# Patient Record
Sex: Female | Born: 1975 | Marital: Single | Smoking: Never smoker
Health system: Southern US, Community
[De-identification: ages and names within clinical notes are randomized; demographics above are authoritative.]

## PROBLEM LIST (undated history)

## (undated) DIAGNOSIS — N871 Moderate cervical dysplasia: Secondary | ICD-10-CM

## (undated) DIAGNOSIS — B977 Papillomavirus as the cause of diseases classified elsewhere: Secondary | ICD-10-CM

## (undated) DIAGNOSIS — R87629 Unspecified abnormal cytological findings in specimens from vagina: Secondary | ICD-10-CM

## (undated) HISTORY — PX: HX LEEP PROCEDURE: SHX91

## (undated) HISTORY — PX: LAPAROSCOPIC TUBAL LIGATION: SUR803

## (undated) HISTORY — DX: Moderate cervical dysplasia: N87.1

## (undated) HISTORY — DX: Papillomavirus as the cause of diseases classified elsewhere: B97.7

## (undated) HISTORY — PX: HX WISDOM TEETH EXTRACTION: SHX21

## (undated) HISTORY — PX: HX TONSILLECTOMY: SHX27

## (undated) HISTORY — PX: LEEP: SHX91

## (undated) HISTORY — PX: COLPOSCOPY: SHX161

## (undated) HISTORY — DX: Unspecified abnormal cytological findings in specimens from vagina: R87.629

## (undated) HISTORY — PX: CERVICAL CONE BIOPSY: SUR198

## (undated) HISTORY — PX: HYSTEROSCOPY: SHX211

---

## 2010-07-01 HISTORY — PX: HX LEEP PROCEDURE: SHX91

## 2011-07-02 HISTORY — PX: HX HYSTEROSCOPY: 2100001316

## 2014-05-04 ENCOUNTER — Encounter: Payer: Self-pay | Admitting: *Deleted

## 2014-05-04 DIAGNOSIS — R87619 Unspecified abnormal cytological findings in specimens from cervix uteri: Secondary | ICD-10-CM | POA: Insufficient documentation

## 2014-05-04 DIAGNOSIS — Z9889 Other specified postprocedural states: Secondary | ICD-10-CM

## 2014-05-04 DIAGNOSIS — R87618 Other abnormal cytological findings on specimens from cervix uteri: Secondary | ICD-10-CM

## 2014-05-10 ENCOUNTER — Encounter: Payer: Self-pay | Admitting: Obstetrics and Gynecology

## 2014-05-10 ENCOUNTER — Ambulatory Visit (INDEPENDENT_AMBULATORY_CARE_PROVIDER_SITE_OTHER): Payer: Self-pay | Admitting: Obstetrics and Gynecology

## 2014-05-10 VITALS — BP 110/70 | Ht 61.0 in | Wt 122.0 lb

## 2014-05-10 DIAGNOSIS — Z3202 Encounter for pregnancy test, result negative: Secondary | ICD-10-CM

## 2014-05-10 DIAGNOSIS — B977 Papillomavirus as the cause of diseases classified elsewhere: Secondary | ICD-10-CM | POA: Insufficient documentation

## 2014-05-10 DIAGNOSIS — Z3200 Encounter for pregnancy test, result unknown: Secondary | ICD-10-CM

## 2014-05-10 DIAGNOSIS — R87610 Atypical squamous cells of undetermined significance on cytologic smear of cervix (ASC-US): Secondary | ICD-10-CM

## 2014-05-10 NOTE — Progress Notes (Signed)
Patient ID: Rocky Craftsmanda Dimiceli, female   DOB: 1976-01-19, 38 y.o.   MRN: [id number] Pt here today for colposcopy. Pt has had a colposcopy, LEEP ,and cone biopsy for multiple abnormal paps. UPT is negative today, pt is currently on BCP. Consent signed and procedure explained.   Rocky Craftsmanda Kynard 38 y.o. G1P1001 here for colposcopy for abnormal pap at health dept. results not identified in chart pap smear on ASCUS + HPV.  Discussed role for HPV in cervical dysplasia, need for surveillance.  Patient given informed consent, signed copy in the chart, time out was performed.  Placed in lithotomy position. Cervix viewed with speculum and colposcope after application of acetic acid.   Colposcopy adequate? Yes  HPV changes noted at 6 o'clock; only a 1-2 mm area of condylomatous changes biopsies obtained at unable to biopsy due to fibrosis from prior CKC, Results reviewed , and any further biopsy considered excessive.   ECC specimen obtained.stenotic, cannot allow ecc   Colposcopy IMPRESSION:  Patient was given post procedure instructions. Will follow up pathology and manage accordingly.  Routine preventative health maintenance measures emphasized.

## 2014-05-30 LAB — POCT URINE PREGNANCY: Preg Test, Ur: NEGATIVE

## 2015-03-08 LAB — HM PAP SMEAR: HM Pap smear: NEGATIVE

## 2015-04-07 ENCOUNTER — Encounter: Payer: Self-pay | Admitting: Obstetrics and Gynecology

## 2015-04-21 ENCOUNTER — Encounter: Payer: Self-pay | Admitting: *Deleted

## 2015-04-21 DIAGNOSIS — A630 Anogenital (venereal) warts: Secondary | ICD-10-CM

## 2015-04-25 ENCOUNTER — Encounter: Payer: Self-pay | Admitting: Obstetrics & Gynecology

## 2015-04-28 ENCOUNTER — Encounter: Payer: Self-pay | Admitting: Obstetrics & Gynecology

## 2015-05-02 ENCOUNTER — Ambulatory Visit (INDEPENDENT_AMBULATORY_CARE_PROVIDER_SITE_OTHER): Payer: No Typology Code available for payment source | Admitting: Obstetrics & Gynecology

## 2015-05-02 ENCOUNTER — Encounter: Payer: Self-pay | Admitting: Obstetrics & Gynecology

## 2015-05-02 VITALS — BP 100/60 | HR 68 | Ht 61.0 in | Wt 118.0 lb

## 2015-05-02 DIAGNOSIS — R896 Abnormal cytological findings in specimens from other organs, systems and tissues: Secondary | ICD-10-CM | POA: Diagnosis not present

## 2015-05-02 DIAGNOSIS — Z3202 Encounter for pregnancy test, result negative: Secondary | ICD-10-CM | POA: Diagnosis not present

## 2015-05-02 DIAGNOSIS — IMO0002 Reserved for concepts with insufficient information to code with codable children: Secondary | ICD-10-CM

## 2015-05-02 LAB — POCT URINE PREGNANCY: Preg Test, Ur: NEGATIVE

## 2015-05-02 NOTE — Progress Notes (Signed)
Patient ID: Alexis Hampton, Alexis Hampton   DOB: 1975-07-23, 39 y.o.   MRN: [id number] Colposcopy Procedure Note:  Colposcopy Procedure Note  Indications: Pap smear 2 months ago showed: ASCUS with POSITIVE high risk HPV. The prior pap showed ASCUS with POSITIVE high risk HPV.  Prior cervical/vaginal disease: normal exam without visible pathology. Prior cervical treatment: cone and LEEP in past.  Her last cervix procedure 4 years ago  Smoker:  No. New sexual partner:  Yes.    : time frame:  4 years  History of abnormal Pap: yes  Procedure Details  The risks and benefits of the procedure and Verbal written informed consent obtained.  Speculum placed in vagina and excellent visualization of cervix achieved, cervix swabbed x 3 with acetic acid solution.  Findings: Cervix: no visible lesions, no mosaicism, no punctation and no abnormal vasculature; no biopsies taken. Vaginal inspection: vaginal colposcopy performed and was noral Vulvar colposcopy: vulvar colposcopy not performed.  Specimens: none  Complications: none.  Plan: Follow up Pap here in 1 year

## 2016-05-02 ENCOUNTER — Ambulatory Visit (INDEPENDENT_AMBULATORY_CARE_PROVIDER_SITE_OTHER): Payer: BLUE CROSS/BLUE SHIELD | Admitting: Women's Health

## 2016-05-02 ENCOUNTER — Other Ambulatory Visit (HOSPITAL_COMMUNITY)
Admission: RE | Admit: 2016-05-02 | Discharge: 2016-05-02 | Disposition: A | Discharge status: Home / Self Care | Payer: BLUE CROSS/BLUE SHIELD | Source: Ambulatory Visit | Attending: Obstetrics & Gynecology | Admitting: Obstetrics & Gynecology

## 2016-05-02 ENCOUNTER — Encounter: Payer: Self-pay | Admitting: Women's Health

## 2016-05-02 VITALS — BP 112/74 | HR 68 | Wt 117.5 lb

## 2016-05-02 DIAGNOSIS — Z01411 Encounter for gynecological examination (general) (routine) with abnormal findings: Secondary | ICD-10-CM

## 2016-05-02 DIAGNOSIS — Z01419 Encounter for gynecological examination (general) (routine) without abnormal findings: Secondary | ICD-10-CM | POA: Insufficient documentation

## 2016-05-02 DIAGNOSIS — Z1151 Encounter for screening for human papillomavirus (HPV): Secondary | ICD-10-CM | POA: Insufficient documentation

## 2016-05-02 DIAGNOSIS — N6320 Unspecified lump in the left breast, unspecified quadrant: Secondary | ICD-10-CM | POA: Diagnosis not present

## 2016-05-02 MED ORDER — LEVONORGEST-ETH ESTRAD 91-DAY 0.15-0.03 &0.01 MG PO TABS: 1.0000 | ORAL_TABLET | Freq: Every day | ORAL | 11 refills | Status: AC

## 2016-05-02 NOTE — Patient Instructions (Addendum)
[facility] Tues 11/7 @ 11:20pm, be there 11:10pm. No lotion/deoderant/powder/perfume that day  [facility] [place] [address] [place] [place] [place] 11/11  Ethinyl Estradiol; Levonorgestrel tablets What is this medicine? ETHINYL ESTRADIOL; LEVONORGESTREL (ETH in il es tra DYE ole; LEE voh nor jes trel) is an oral contraceptive. It combines two types of female hormones, an estrogen and a progestin. They are used to prevent ovulation and pregnancy. This medicine may be used for other purposes; ask your health care provider or pharmacist if you have questions. What should I tell my health care provider before I take this medicine? They need to know if you have or ever had any of these conditions: -abnormal vaginal bleeding -blood vessel disease or blood clots -breast, cervical, endometrial, ovarian, liver, or uterine cancer -diabetes -gallbladder disease -heart disease or recent heart attack -high blood pressure -high cholesterol -kidney disease -liver disease -migraine headaches -stroke -systemic lupus erythematosus (SLE) -tobacco smoker -an unusual or allergic reaction to estrogens, progestins, other medicines, foods, dyes, or preservatives -pregnant or trying to get pregnant -breast-feeding How should I use this medicine? Take this medicine by mouth. To reduce nausea, this medicine may be taken with food. Follow the directions on the prescription label. Take this medicine at the same time each day and in the order directed on the package. Do not take your medicine more often than directed. Contact your pediatrician regarding the use of this medicine in children. Special care may be needed. This medicine has been used in female children who have started having menstrual periods. A patient package insert for the product will be given with each prescription and refill. Read this sheet carefully each time. The sheet may change frequently. Overdosage:  If you think you have taken too much of this medicine contact a poison control center or emergency room at once. NOTE: This medicine is only for you. Do not share this medicine with others. What if I miss a dose? If you miss a dose, refer to the patient information sheet you received with your medicine for direction. If you miss more than one pill, this medicine may not be as effective and you may need to use another form of birth control. What may interact with this medicine? -acetaminophen -antibiotics or medicines for infections, especially rifampin, rifabutin, rifapentine, and griseofulvin, and possibly penicillins or tetracyclines -aprepitant -ascorbic acid (vitamin C) -atorvastatin -barbiturate medicines, such as phenobarbital -bosentan -carbamazepine -caffeine -clofibrate -cyclosporine -dantrolene -doxercalciferol -felbamate -grapefruit juice -hydrocortisone -medicines for anxiety or sleeping problems, such as diazepam or temazepam -medicines for diabetes, including pioglitazone -mineral oil -modafinil -mycophenolate -nefazodone -oxcarbazepine -phenytoin -prednisolone -ritonavir or other medicines for HIV infection or AIDS -rosuvastatin -selegiline -soy isoflavones supplements -St. John's wort -tamoxifen or raloxifene -theophylline -thyroid hormones -topiramate -warfarin This list may not describe all possible interactions. Give your health care provider a list of all the medicines, herbs, non-prescription drugs, or dietary supplements you use. Also tell them if you smoke, drink alcohol, or use illegal drugs. Some items may interact with your medicine. What should I watch for while using this medicine? Visit your doctor or health care professional for regular checks on your progress. You will need a regular breast and pelvic exam and Pap smear while on this medicine. Use an additional method of contraception during the first cycle that you take these tablets. If you  have any reason to think you are pregnant, stop taking this medicine right away and contact your doctor or health care  professional. If you are taking this medicine for hormone related problems, it may take several cycles of use to see improvement in your condition. Smoking increases the risk of getting a blood clot or having a stroke while you are taking birth control pills, especially if you are more than 40 years old. You are strongly advised not to smoke. This medicine can make your body retain fluid, making your fingers, hands, or ankles swell. Your blood pressure can go up. Contact your doctor or health care professional if you feel you are retaining fluid. This medicine can make you more sensitive to the sun. Keep out of the sun. If you cannot avoid being in the sun, wear protective clothing and use sunscreen. Do not use sun lamps or tanning beds/booths. If you wear contact lenses and notice visual changes, or if the lenses begin to feel uncomfortable, consult your eye care specialist. In some women, tenderness, swelling, or minor bleeding of the gums may occur. Notify your dentist if this happens. Brushing and flossing your teeth regularly may help limit this. See your dentist regularly and inform your dentist of the medicines you are taking. If you are going to have elective surgery, you may need to stop taking this medicine before the surgery. Consult your health care professional for advice. This medicine does not protect you against HIV infection (AIDS) or any other sexually transmitted diseases. What side effects may I notice from receiving this medicine? Side effects that you should report to your doctor or health care professional as soon as possible: -breast tissue changes or discharge -changes in vaginal bleeding during your period or between your periods -chest pain -coughing up blood -dizziness or fainting spells -headaches or migraines -leg, arm or groin pain -severe or sudden  headaches -stomach pain (severe) -sudden shortness of breath -sudden loss of coordination, especially on one side of the body -speech problems -symptoms of vaginal infection like itching, irritation or unusual discharge -tenderness in the upper abdomen -vomiting -weakness or numbness in the arms or legs, especially on one side of the body -yellowing of the eyes or skin Side effects that usually do not require medical attention (report to your doctor or health care professional if they continue or are bothersome): -breakthrough bleeding and spotting that continues beyond the 3 initial cycles of pills -breast tenderness -mood changes, anxiety, depression, frustration, anger, or emotional outbursts -increased sensitivity to sun or ultraviolet light -nausea -skin rash, acne, or brown spots on the skin -weight gain (slight) This list may not describe all possible side effects. Call your doctor for medical advice about side effects. You may report side effects to FDA at 1-800-FDA-1088. Where should I keep my medicine? Keep out of the reach of children. Store at room temperature between 15 and 30 degrees C (59 and 86 degrees F). Throw away any unused medicine after the expiration date. NOTE: This sheet is a summary. It may not cover all possible information. If you have questions about this medicine, talk to your doctor, pharmacist, or health care provider.    2016, Elsevier/Gold Standard. (2013-09-27 10:20:56)

## 2016-05-02 NOTE — Progress Notes (Signed)
Subjective:   Rocky Craftsmanda Crookston is a 40 y.o. 561P1001 Caucasian female here for a routine well-woman exam.  No LMP recorded (lmp unknown). Patient is not currently having periods (Reason: Oral contraceptives).    Current complaints: none PCP: none       Does not desire labs H/O multiple abnormal paps, has had LEEP and Cone bx  Social History: Sexual: heterosexual Marital Status: dating Living situation: w/ boyfriend and son Occupation: nanny Tobacco/alcohol: no tobacco or etoh Illicit drugs: no history of illicit drug use  The following portions of the patient's history were reviewed and updated as appropriate: allergies, current medications, past family history, past medical history, past social history, past surgical history and problem list.  Past Medical History Past Medical History:  Diagnosis Date  . Vaginal Pap smear, abnormal    multiple     Past Surgical History Past Surgical History:  Procedure Laterality Date  . CERVICAL CONE BIOPSY    . COLPOSCOPY    . HYSTEROSCOPY    . LEEP      Gynecologic History G1P1001  No LMP recorded (lmp unknown). Patient is not currently having periods (Reason: Oral contraceptives). Contraception: OCP (estrogen/progesterone) [facility] Results were: normal Last mammogram: never. Results were: n/a Last TCS: never  Obstetric History OB History  Gravida Para Term Preterm AB Living  1 1 1     1   SAB TAB Ectopic Multiple Live Births          1    # Outcome Date GA Lbr Len/2nd Weight Sex Delivery Anes PTL Lv  1 Term 08/24/02 [id number]  9 lb 1 oz (4.111 kg) M CS-Unspec  N LIV      Current Medications Current Outpatient Prescriptions on File Prior to Visit  Medication Sig Dispense Refill  . Multiple Vitamin (MULTIVITAMIN) tablet Take 1 tablet by mouth daily.    . Levonorgestrel-Ethinyl Estradiol (SEASONIQUE) 0.15-0.03 &0.01 MG tablet Take 1 tablet by mouth daily.     No current facility-administered medications on file prior  to visit.     Review of Systems Patient denies any headaches, blurred vision, shortness of breath, chest pain, abdominal pain, problems with bowel movements, urination, or intercourse.  Objective:  BP 112/74 (BP Location: Right Arm, Patient Position: Sitting, Cuff Size: Normal)   Pulse 68   Wt 117 lb 8 oz (53.3 kg)   LMP  (LMP Unknown)   BMI 22.20 kg/m  Physical Exam  General:  Well developed, well nourished, no acute distress. She is alert and oriented x3. Skin:  Warm and dry Neck:  Midline trachea, no thyromegaly or nodules Cardiovascular: Regular rate and rhythm, no murmur heard Lungs:  Effort normal, all lung fields clear to auscultation bilaterally Breasts:  Rt: No dominant palpable mass, retraction, or nipple discharge, Lt: large palpable ridge/mass- feels like fatty tissue Abdomen:  Soft, non tender, no hepatosplenomegaly or masses Pelvic:  External genitalia is normal in appearance.  The vagina is normal in appearance. The cervix is stenotic and flat-has had multiple cervical procedures in past, no CMT.  Thin prep pap is done w/ HR HPV cotesting. Uterus is felt to be normal size, shape, and contour.  No adnexal masses or tenderness noted. Extremities:  No swelling or varicosities noted Psych:  She has a normal mood and affect  Assessment:   Healthy well-woman exam H/o multiple abnormal paps w/ cervical procedures Lt breast mass  Plan:  Breast u/s and mammo 11/7 @ 11:20, be there at 11:10  at [facility], no lotion/deoderant/powder/perfume that day F/U 2849yr for physical, or sooner if needed Mammogram as above  Colonoscopy @40yo , or sooner if problems  Marge DuncansBooker, Erubiel Manasco Randall CNM, [facility]NP[facility]BC 05/02/2016 2:07 PM

## 2016-05-06 ENCOUNTER — Other Ambulatory Visit: Payer: No Typology Code available for payment source | Admitting: Obstetrics & Gynecology

## 2016-05-06 LAB — CYTOLOGY - PAP
ADEQUACY: ABSENT
Diagnosis: NEGATIVE
HPV (WINDOPATH): NOT DETECTED

## 2016-05-07 ENCOUNTER — Ambulatory Visit (HOSPITAL_COMMUNITY)
Admission: RE | Admit: 2016-05-07 | Discharge: 2016-05-07 | Disposition: A | Discharge status: Home / Self Care | Payer: BLUE CROSS/BLUE SHIELD | Source: Ambulatory Visit | Attending: Women's Health | Admitting: Women's Health

## 2016-05-07 DIAGNOSIS — N6002 Solitary cyst of left breast: Secondary | ICD-10-CM | POA: Diagnosis not present

## 2016-05-07 DIAGNOSIS — N6320 Unspecified lump in the left breast, unspecified quadrant: Secondary | ICD-10-CM

## 2016-07-12 ENCOUNTER — Telehealth: Payer: Self-pay | Admitting: *Deleted

## 2016-07-12 NOTE — Telephone Encounter (Signed)
Spoke with pt letting her know her pap was normal. Pt voiced understanding. JSY 

## 2017-01-09 IMAGING — US ULTRASOUND LEFT BREAST LIMITED
1 series · 8 of 8 positions shown · non-contrast
Comparison: None.

CLINICAL DATA: 39-year-old female presenting for baseline mammogram
and evaluation of a palpable area of concern in the left breast at 3
o'clock identified by her physician.

EXAM:
2D DIGITAL DIAGNOSTIC BILATERAL MAMMOGRAM WITH CAD AND ADJUNCT TOMO
LEFT BREAST ULTRASOUND

[Series 1: ultrasound left breast limited · 0.07mm/px · 8 of 8 slices shown]
[im 1/8]
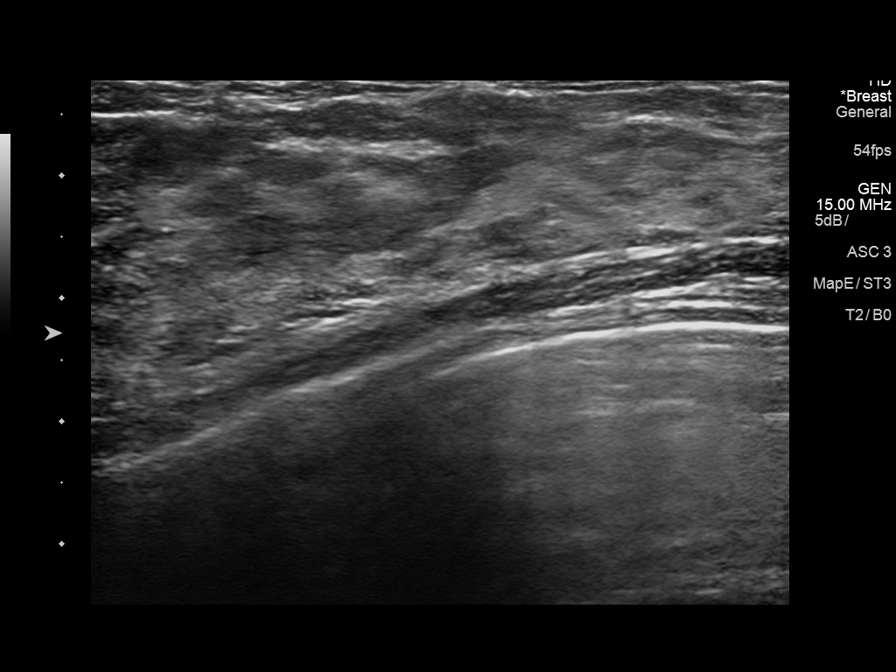
[im 2/8]
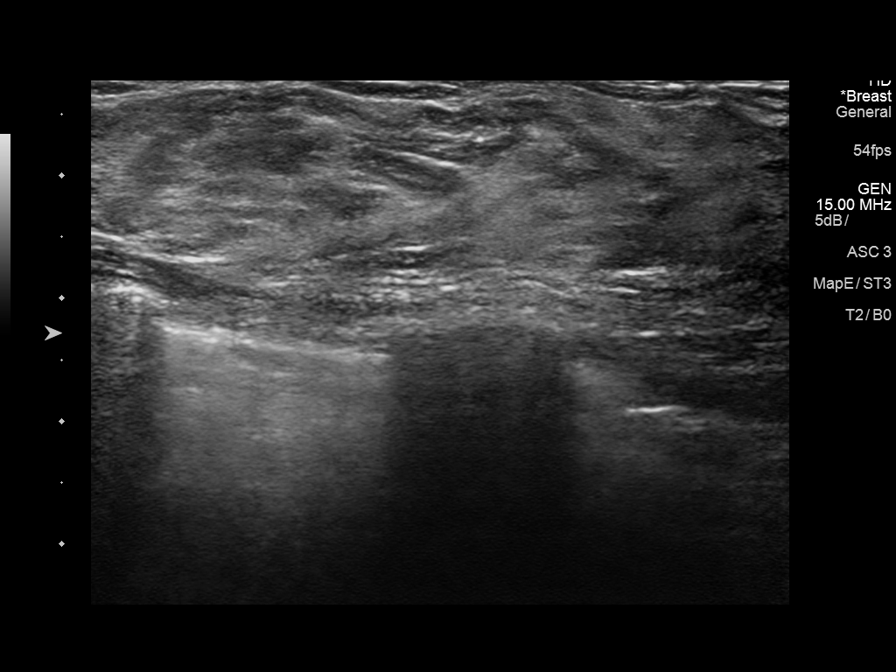
[im 3/8]
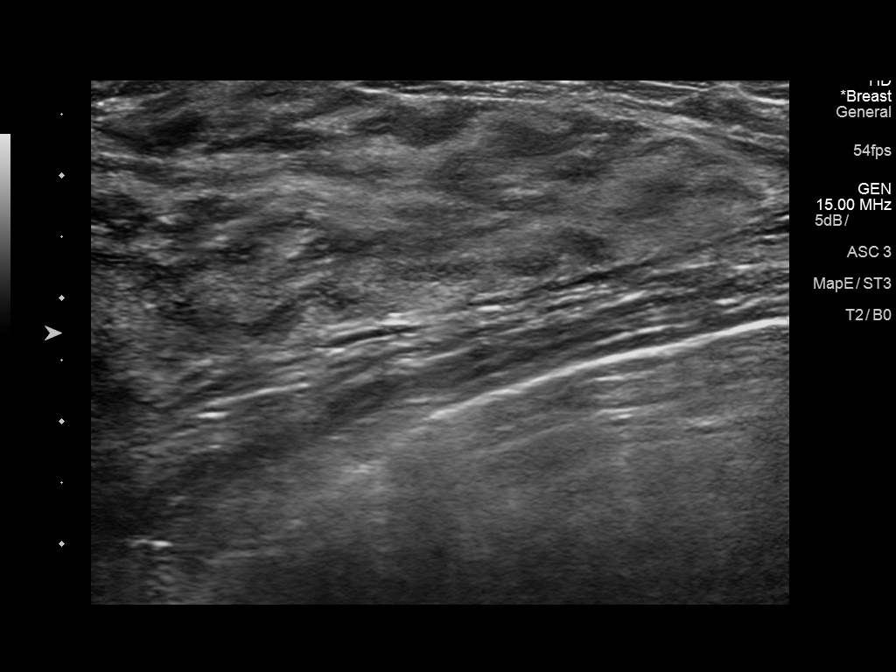
[im 4/8]
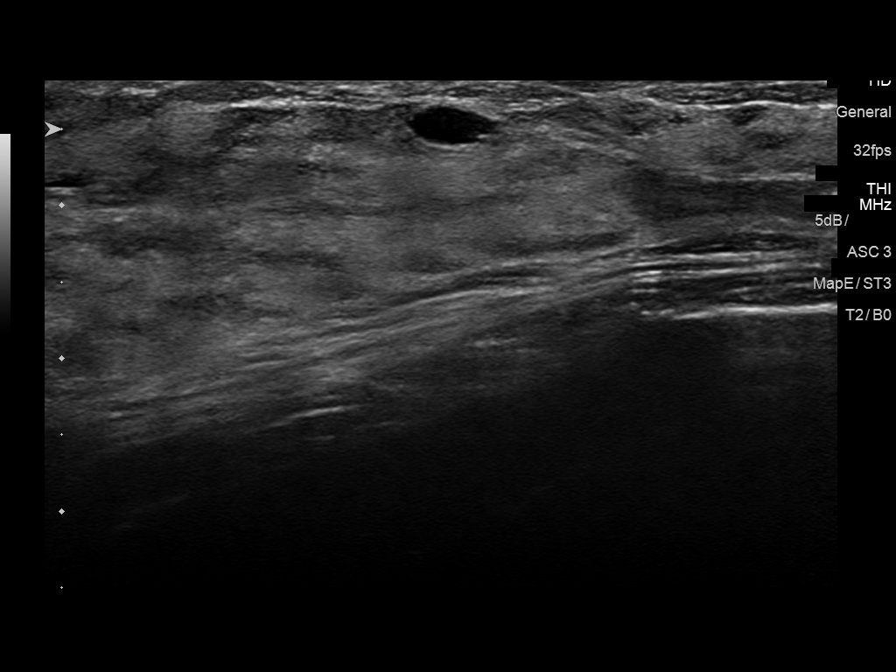
[im 5/8]
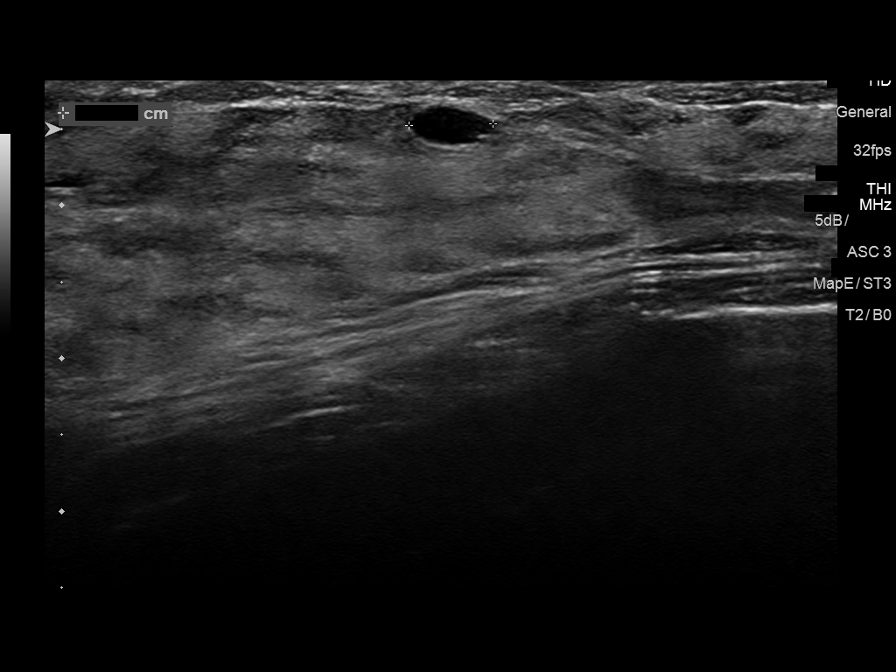
[im 6/8]
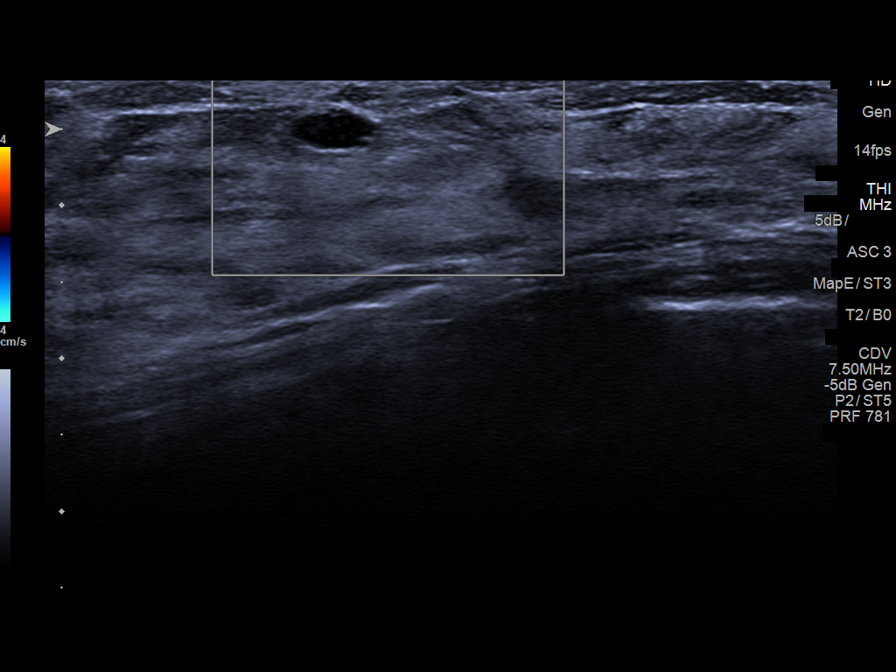
[im 7/8]
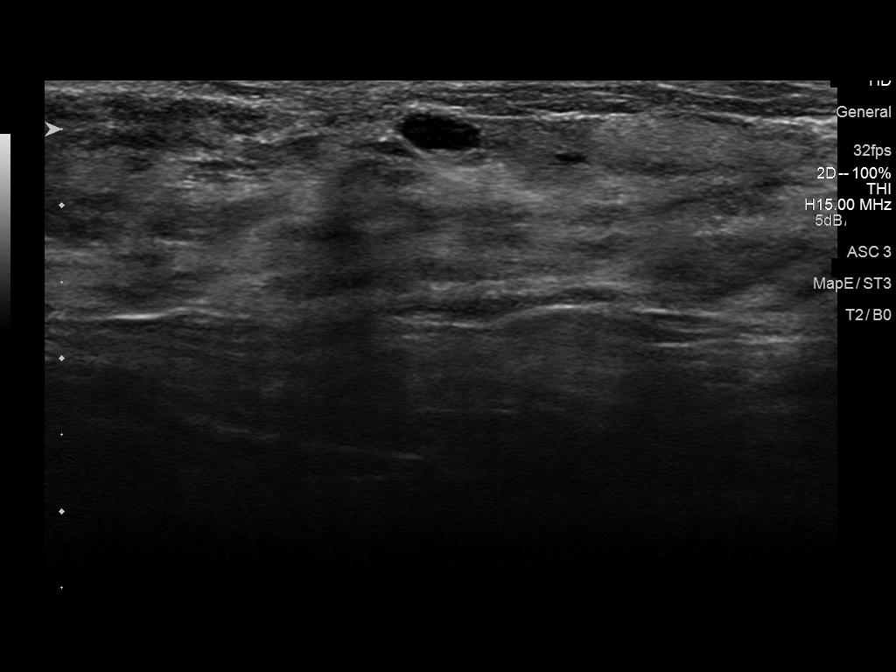
[im 8/8]
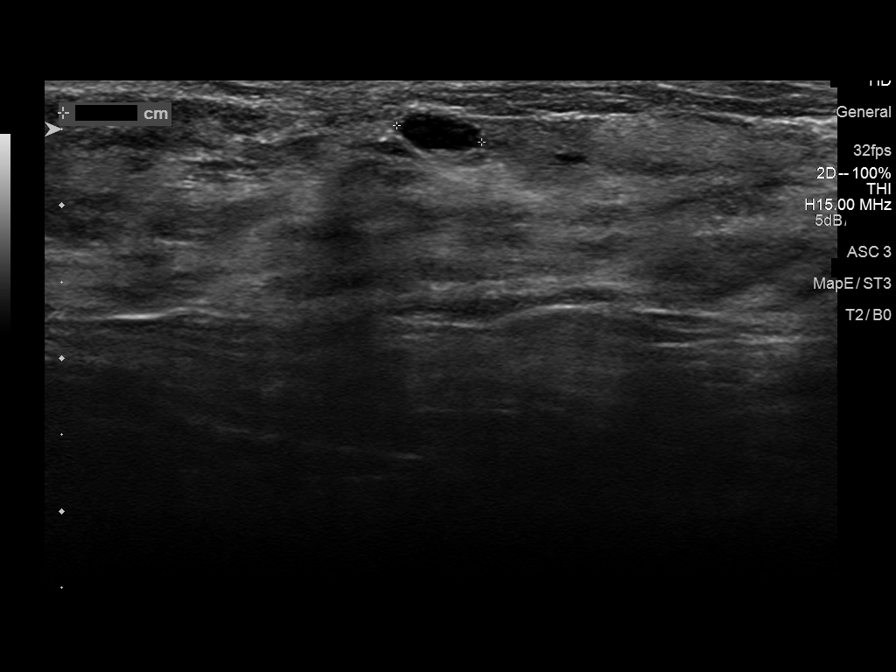

[8 of 8 positions shown; findings below may reference images not displayed]

ACR Breast Density Category d: The breast tissue is extremely dense,
which lowers the sensitivity of mammography.
FINDINGS: No suspicious calcifications, masses or areas of distortion are seen
in the bilateral breasts.

Mammographic images were processed with CAD.

No discrete palpable masses are identified in the left breast at 3
o'clock.

Ultrasound targeted to the left breast at 3 o'clock demonstrates
normal fibroglandular tissue. There is a benign-appearing oval
circumscribed anechoic mass at 3 o'clock, 2 cm from the nipple
consistent with a benign cyst. No suspicious masses or areas of
distortion are identified.
IMPRESSION: 1. A benign 5 mm cyst is identified in the left breast at 3 o'clock.
Otherwise, no mammographic or targeted sonographic abnormalities
identified to correspond with the palpable site of concern.

2.  No mammographic evidence of malignancy in the bilateral breasts.

RECOMMENDATION:
1. Clinical follow-up recommended for the palpable area of concern
in the left breast. Any further workup should be based on clinical
grounds.

2.  Screening mammogram in one year.(Code:[id number]-[id number])

I have discussed the findings and recommendations with the patient.
Results were also provided in writing at the conclusion of the
visit. If applicable, a reminder letter will be sent to the patient
regarding the next appointment.

BI-RADS CATEGORY  2: Benign.

## 2017-09-12 ENCOUNTER — Other Ambulatory Visit: Payer: Self-pay | Admitting: Women's Health

## 2019-11-30 DIAGNOSIS — Z8041 Family history of malignant neoplasm of ovary: Secondary | ICD-10-CM

## 2019-11-30 HISTORY — DX: Family history of malignant neoplasm of ovary: Z80.41

## 2022-01-09 ENCOUNTER — Telehealth (INDEPENDENT_AMBULATORY_CARE_PROVIDER_SITE_OTHER): Payer: Self-pay | Admitting: Obstetrics & Gynecology

## 2022-01-09 ENCOUNTER — Other Ambulatory Visit (INDEPENDENT_AMBULATORY_CARE_PROVIDER_SITE_OTHER): Payer: Self-pay | Admitting: Obstetrics & Gynecology

## 2022-01-09 DIAGNOSIS — Z1231 Encounter for screening mammogram for malignant neoplasm of breast: Secondary | ICD-10-CM

## 2022-01-09 DIAGNOSIS — R928 Other abnormal and inconclusive findings on diagnostic imaging of breast: Secondary | ICD-10-CM

## 2022-01-10 NOTE — Telephone Encounter (Signed)
-----   Message from Stasia Cavalier, MD sent at 01/09/2022  1:07 PM EDT -----  Notify pt results and schedule additional views right breast

## 2022-01-10 NOTE — Telephone Encounter (Signed)
Pt notified of results and info faxed to [facility] ta

## 2022-01-17 ENCOUNTER — Telehealth (INDEPENDENT_AMBULATORY_CARE_PROVIDER_SITE_OTHER): Payer: Self-pay | Admitting: Obstetrics & Gynecology

## 2022-01-17 NOTE — Telephone Encounter (Signed)
-----   Message from Ramona Davidson-Dagostine, MD sent at 01/09/2022  1:07 PM EDT -----  Notify pt results and schedule additional views right breast

## 2022-01-17 NOTE — Telephone Encounter (Signed)
Notify patient additional views and [place] just showed benign cysts and she can return to routine screening next year

## 2022-01-17 NOTE — Telephone Encounter (Signed)
Pt notified of results ta

## 2022-06-17 ENCOUNTER — Other Ambulatory Visit (INDEPENDENT_AMBULATORY_CARE_PROVIDER_SITE_OTHER): Payer: Self-pay | Admitting: Obstetrics & Gynecology

## 2022-07-12 ENCOUNTER — Encounter (INDEPENDENT_AMBULATORY_CARE_PROVIDER_SITE_OTHER): Payer: Self-pay | Admitting: Obstetrics & Gynecology

## 2022-07-15 ENCOUNTER — Ambulatory Visit (INDEPENDENT_AMBULATORY_CARE_PROVIDER_SITE_OTHER): Payer: Self-pay | Admitting: Obstetrics & Gynecology

## 2022-07-31 ENCOUNTER — Ambulatory Visit (INDEPENDENT_AMBULATORY_CARE_PROVIDER_SITE_OTHER): Payer: Self-pay | Admitting: Obstetrics & Gynecology

## 2022-08-21 ENCOUNTER — Encounter (INDEPENDENT_AMBULATORY_CARE_PROVIDER_SITE_OTHER): Payer: Self-pay | Admitting: OBSTETRICS/GYNECOLOGY

## 2022-08-21 NOTE — Progress Notes (Signed)
05/2021 annual DAG       HISTORY OF PRESENT ILLNESS   Kristie Juarez is a 47 year old female.    Has always had occ mild pain with intercourse-not worsening.  Periods regular.  In [place] state and [place] some with husband's work and staying in [place].  Son 18, working at [employer].  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  Surgical / Procedural: Surgical / procedural history 2012 mirena removed   2011 pre cancerous cell removed   11/2001 Wisdom Teeth Removed   05/04/2019 lap b/l salpingectomy with removal of imbedded iud.  Tests: A Pap smear was performed 05/13/2019 pap ascus and hpv aptima pos.  Pregnancy: Gravida 1 and para 1.  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    Human papilloma virus infection  Covid 19 vaccine Moderna x 3.  Surgical:    Tonsillectomy 05/2004   Cervical conization loop electrode excision 09/2010 CINI   Tubal ligation 05/04/2019  via b/l salpingectomy   Hysteroscopy 08/2011   Cesarean section 2004   Laparoscopy 04/2019 .bilateral salpingectomy for sterilization and found to have bilateral hydrosalpinges and IUD anterior cul-de-sac was also removed        PREVIOUS TESTS    Imaging:  Mammogram:   Screening mammogram was performed 11/22/2019.  Pathology:  Cytology:   Cervical Pap smear 05/23/2020.        FAMILY HISTORY   Systemic hypertension   Ovarian neoplasm P. 2nd cousin dx 30   Malignant neoplasm Father-Lung        SOCIAL HISTORY   Social history unchanged.  Alcohol: Alcohol.  Habits: Not exercising regularly.  Work: Working full time Artist at [employer]  Sexual: Sexually active.  Sexual orientation heterosexual, heterosexual, gender identity female, and female.  Birth control method lap b/l salpingectomy and taking oral contraceptives.        CURRENT MEDICATION     ChoiceFul Multivitamin Oral Capsule  0 days, 0 refills        ALLERGIES     No Known Allergies        REVIEW OF SYSTEMS   Systemic: No systemic symptoms.  Head: No head symptoms.  Neck: No neck symptoms.  Eyes: No eye symptoms.  Otolaryngeal: No otolaryngeal symptoms.  Breasts: No breast symptoms.  Cardiovascular: No cardiovascular symptoms.  Pulmonary: No pulmonary symptoms.  Gastrointestinal: No gastrointestinal symptoms.  Genitourinary: No genitourinary symptoms,  no hematuria, and no increase in urinary frequency.  No dysuria.  No genital lesion and no bowel/bladder changes.  Endocrine: No endocrine symptoms.  Musculoskeletal: No musculoskeletal symptoms.  Neurological: No neurological symptoms.  Psychological: No psychological symptoms.  Skin: No pruritus.  No skin lesions.        PHYSICAL FINDINGS     Vitals taken 06/13/2021 08:37 am   BP-Sitting L  112/80 mmHg 100 - 120/56 - 80   BP Cuff Size  Regular   Height  60 in 59 - 69   Weight  129 lbs  98 - 183   Body Mass Index  25.2 kg/m2    Body Surface Area  1.55 m2       General Appearance:   Well developed.   Well nourished.   In no acute distress.  Head:   Normal.  Neck:  Thyroid:  Showed no abnormalities.  Lymph Nodes:   Supraclavicular lymph nodes were not enlarged.   Axillary lymph nodes were not enlarged.  Breasts:  General/bilateral:    Diffuse  fibrous tissue was found in both breasts.   Nipples showed no abnormalities.   No abnormal breast secretion was observed.   No erythema of the breast.   No induration of the breast.   No dimpling was seen in the breast.   No peau d'orange was noted in the breast.  Right Breast:    Nipple was normal.   No abnormal secretion.   No mass was found.   No tenderness.  Left Breast:    Nipple was normal.   No abnormal secretion.   No mass was found.   No tenderness.  Lungs:   Clear to auscultation.  Cardiovascular:  Heart Rate And Rhythm:  Normal.  Murmurs:  No murmurs were heard.  Abdomen:  Palpation:  Abdominal non-tender.   No mass was palpated in the abdomen.  Genitalia:  External:  Genitalia showed no abnormalities.  Pelvic:   No ovarian mass.   No ovarian mass.  Vagina:  A vaginal discharge was observed menstrual blood.   Mucosa was not dry.   Mucosa was not atrophied.   No cystocele was observed.   No rectocele was observed.  Cervix:  Showed no lesion.   Did not demonstrate pain elicited by motion.  Uterus:  Not enlarged.   Not tender.  Uterine Adnexae:  Uterine adnexa was not tender.  Rectovaginal:  Tissue was normal.  Rectal:   Exam: normal.  Hair:   Normal.        TESTS   Laboratory-based Chemistry:  Fecal Analysis:   Fecal occult blood test was negative.        ASSESSMENT   Ramona A. Davidson-Dagostine M.D. made the following assessments    Z01.419 - Encounter for gynecological examination (general) (routine) without abnormal findings        PLAN      HPV Screening  Lab: Pap Smear with HPV        Other  Annual        Screening malignancy colon  Outside Lab: Cologuard        Screening mammogram for malig neo breast  Breast Care: 3D Mammogram Screening [id number])      Ramona A. Davidson-Dagostine M.D. ordered    Mammogram   Cervical Pap smear (collected)   Cervical high risk human papilloma virus DNA test   Follow-up visit 1 year or as needed  Yearly paps d/t prior dysplasia,  mammogram ordered.  Colon ca  screening discussed and she is opting for Cologuard.     Ramona A. Sandi Mariscal M.D.   Electronically signed by: Rosezella [place]. Frederic Jericho, M.D.     Date: 06/13/2021 09:27

## 2022-09-02 ENCOUNTER — Ambulatory Visit (INDEPENDENT_AMBULATORY_CARE_PROVIDER_SITE_OTHER): Payer: Self-pay | Admitting: OBSTETRICS/GYNECOLOGY

## 2022-09-30 ENCOUNTER — Ambulatory Visit (HOSPITAL_COMMUNITY): Payer: Self-pay | Admitting: Obstetrics & Gynecology

## 2022-09-30 ENCOUNTER — Encounter (INDEPENDENT_AMBULATORY_CARE_PROVIDER_SITE_OTHER): Payer: Self-pay | Admitting: Obstetrics & Gynecology

## 2022-09-30 ENCOUNTER — Ambulatory Visit (INDEPENDENT_AMBULATORY_CARE_PROVIDER_SITE_OTHER): Payer: Self-pay | Admitting: Obstetrics & Gynecology

## 2022-09-30 ENCOUNTER — Other Ambulatory Visit: Payer: Self-pay

## 2022-09-30 VITALS — BP 110/60 | Ht 60.0 in | Wt 125.0 lb

## 2022-09-30 DIAGNOSIS — Z01419 Encounter for gynecological examination (general) (routine) without abnormal findings: Secondary | ICD-10-CM

## 2022-09-30 DIAGNOSIS — N6011 Diffuse cystic mastopathy of right breast: Secondary | ICD-10-CM | POA: Insufficient documentation

## 2022-09-30 DIAGNOSIS — Z1231 Encounter for screening mammogram for malignant neoplasm of breast: Secondary | ICD-10-CM

## 2022-09-30 DIAGNOSIS — Z1211 Encounter for screening for malignant neoplasm of colon: Secondary | ICD-10-CM

## 2022-09-30 DIAGNOSIS — Z87410 Personal history of cervical dysplasia: Secondary | ICD-10-CM

## 2022-09-30 DIAGNOSIS — N6311 Unspecified lump in the right breast, upper outer quadrant: Secondary | ICD-10-CM

## 2022-09-30 HISTORY — DX: Personal history of cervical dysplasia: Z87.410

## 2022-09-30 NOTE — Progress Notes (Signed)
[place]  OB/GYN, [facility]  [place] [address]  [phone]         ANNUAL VISIT    PATIENT: Kristie Juarez  CHART NUMBER: [id number]  DATE OF SERVICE: 09/30/2022    CC:   Chief Complaint   Patient presents with    Annual Exam     Pt here today for annual exam c/o right breast lump    Breast Lump       HPI: Kristie Juarez is a 47 y.o. year old G1P1 Patient's last menstrual period was 09/23/2022.Marland Kitchen  Here for her annual exam. Mammogram 12/2021 with mass on right breast and f/u spot compressions and [place] c/w multiple breasts cysts at 6 and 7:00. Noticed a non-tender lump on right breast at 11:00 last week.  Son 59 and doing well, working at [employer].  Pt moved back from [place] last year and now in [place].  Didn't do Cologuard last year and prefers to do colonoscopy now. Has skipped a couple of periods this past year.     Problem List          Obstetrics/Gynecology    Fibrocystic breast changes, right    Overview Signed 09/30/2022  8:46 AM by Sherril Croon, MD     12/2021, cysts seen on [place] at 6 and 7:00            Other    History of cervical dysplasia    Overview Signed 09/30/2022  8:41 AM by Sherril Croon, MD     CIN2 s/p LEEP 2012          Menstrual Tracking         OB History:  OB History   Gravida Para Term Preterm AB Living   1 1       1    SAB IAB Ectopic Multiple Live Births           1      # Outcome Date GA Lbr Len/2nd Weight Sex Delivery Anes PTL Lv   1 Para 2004     CS-LTranv        PAST MEDICAL HISTORY:  Past Medical History:   Diagnosis Date    Family history of malignant neoplasm of ovary 11/2019    Ambry genetic testing at [facility] neg    History of cervical dysplasia 09/30/2022    CIN2 s/p LEEP 2012    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          PAST SURGICAL HISTORY:  Past Surgical History:   Procedure Laterality Date    HX CESAREAN SECTION      HX HYSTEROSCOPY  2013     looking for IUD    HX LEEP PROCEDURE  2012    CIN2    HX TONSILLECTOMY      HX WISDOM TEETH EXTRACTION      LAPAROSCOPIC TUBAL LIGATION      bilateral salpingectomy and retrieval of IUD         FAMILY HISTORY:  Family Medical History:       Problem Relation (Age of Onset)    Lung Cancer Father    Ovarian Cancer Paternal cousin            SOCIAL HISTORY:  Social History     Socioeconomic History    Marital status: Single   Tobacco Use    Smoking status: Never  Smokeless tobacco: Never   Vaping Use    Vaping status: Never Used   Substance and Sexual Activity    Alcohol use: Never    Drug use: Never    Sexual activity: Yes     Partners: Male     Birth control/protection: Female Sterilization            09/30/2022     8:05 AM   Comprehensive Health Assessment-Adult   Do you fasten your seatbelt when you are in a car? Yes, usually   Do you exercise 20 minutes 3 or more days per week (such as walking, dancing, biking, mowing grass, swimming)? Yes, most of the time   How often do you eat food that is healthy (fruits, vegetables, lean meats) instead of unhealthy (sweets, fast food, junk food, fatty foods)? Most of the time         Review of Systems:  Weight: 56.7 kg (125 lb)   Height: 152.4 cm (5')  BP (Non-Invasive): 110/60  Systemic : Negative for weight change, chills, fever, night sweats, feeling tired or poorly              Breast: Breast lump (right lump)                             Last Annual GYN Exam: 06/13/21  GYN: Feeling fine, no issues or concerns  Last Pap Date: 06/13/21  Last Pap Results: Normal  Last Mammo Date: 2023                                            CURRENT MEDICATIONS:   Current Outpatient Medications   Medication Sig    multivitamin with folic acid (THERA) [id number] mcg Oral Tablet Take 1 Tablet by mouth Once a day       ALLERGIES:  Patient has no known allergies.     PHYSICAL EXAMINATION:   BP 110/60   Ht 1.524 m (5')   Wt 56.7 kg (125 lb)   LMP 09/23/2022   BMI 24.41 kg/m       Body mass index  is 24.41 kg/m.   General: No acute distress.  HEENT: Normocephalic, Atraumatic  Neck/Thyroid: No evidence of thyromegaly or lymphadenopathy.  Respiratory: Lungs clear to auscultation bilaterally.  Back: no CVAT  Cardiac: Regular rate and rhythm, no murmurs  Abdomen: Soft, Non tender, Non distended without guarding or rebound, no obvious masses noted  Extremities: No cyanosis, clubbing, or edema.  Neurologic: Alert and Orientated. No gross motor abnormalities or deficits noted  Psychiatric: Mood stable.  Hair:  normal distribution  Skin: intact, no visible rashes, no acanthosis nigricans  Breasts:  Right breast: 1cm mass at 11:00, mobile, well circumscribed, non-tender approx 4cm from nipple, normal nipple, no nipple discharge, no tenderness,; no axillary LAN, severely dense tissue, no skin changes       Left breast:  normal nipple, no nipple discharge, no tenderness, no obvious masses; no axillary LAN, severely dense tissue, no skin changes  External Exam: normal female genitalia, no abnormalities    Vaginal Exam:  normal to exam, vaginal mucosa not dry or atrophied, no abnormal discharge-just small amount dark blood.  No cystocele, no rectocele. Normal urethral meatus   Cervix: normal to exam. No lesions,No cervical discharge and no cervical motion tenderness.  Uterus: Uterus is  normal to exam, mobile.Adnexa: non-tender, no appreciable masses  RV exam: normal tone, no masses, no nodularity or induration, hemoccult neg    ASSESSMENT:  (Z01.419) Encounter for well woman exam with routine gynecological exam  (primary encounter diagnosis)  Plan: CYTOPATHOLOGY, GYN +/- HIGH RISK HPV    (Z12.31) Encounter for screening mammogram for malignant neoplasm of breast  Plan: MAMMO BILATERAL SCREENING-ADDL VIEWS/BREAST US         AS REQ BY RAD    (Z12.11) Screening for colon cancer  Plan: POCT OCCULT BLOOD FOR STOOL, [facility] ENDOSCOPY         REQUEST (AMB)    (Z87.410) History of cervical dysplasia    (N60.11) Fibrocystic breast  changes, right    (N63.11) Breast lump on right side at 11 o'clock position  Plan: US BREAST RIGHT-ADDL VIEWS/BREAST US AS REQ BY         RAD, MAMMO UNILAT DIAGNOSTIC RIGHT-ADDL         VIEWS/BREAST US AS REQ BY RAD       PLAN:.  Pap/HPV today and yearly d/t prior dysplasia, Will schedule diagnostic imaging/US right breast mass.  Screening mammogram order for July given.  Will refer for screening colonoscopy.    Orders Placed This Encounter    MAMMO BILATERAL SCREENING-ADDL VIEWS/BREAST US AS REQ BY RAD    US BREAST RIGHT-ADDL VIEWS/BREAST US AS REQ BY RAD    MAMMO UNILAT DIAGNOSTIC RIGHT-ADDL VIEWS/BREAST US AS REQ BY RAD    [facility] ENDOSCOPY REQUEST (AMB)    POCT OCCULT BLOOD FOR STOOL    CYTOPATHOLOGY, GYN +/- HIGH RISK HPV        Follow up appointment in 1 year for annual exam       Sherril Croon, MD      This note was partially generated using MModal Fluency Direct system, and there may be some incorrect words, spellings, and punctuation that were not noted in checking the note before saving.

## 2022-10-04 ENCOUNTER — Other Ambulatory Visit (INDEPENDENT_AMBULATORY_CARE_PROVIDER_SITE_OTHER): Payer: Self-pay

## 2022-10-04 DIAGNOSIS — Z01419 Encounter for gynecological examination (general) (routine) without abnormal findings: Secondary | ICD-10-CM

## 2022-10-14 NOTE — Progress Notes (Signed)
Pap/hrhpv neg

## 2022-10-14 NOTE — Result Encounter Note (Signed)
Pap normal

## 2022-12-09 ENCOUNTER — Other Ambulatory Visit (INDEPENDENT_AMBULATORY_CARE_PROVIDER_SITE_OTHER): Payer: Self-pay | Admitting: Obstetrics & Gynecology

## 2022-12-09 DIAGNOSIS — N6311 Unspecified lump in the right breast, upper outer quadrant: Secondary | ICD-10-CM

## 2022-12-09 DIAGNOSIS — R928 Other abnormal and inconclusive findings on diagnostic imaging of breast: Secondary | ICD-10-CM

## 2022-12-09 DIAGNOSIS — N6001 Solitary cyst of right breast: Secondary | ICD-10-CM

## 2022-12-09 NOTE — Result Encounter Note (Signed)
Notify right mammogram just showed dense tissue, right breast US showed a simple cyst and a minimally complex cyst.  The radiologist advises a 49mo f/u US right breast complex cyst to assure stable in size and appearance which I have ordered

## 2022-12-11 ENCOUNTER — Ambulatory Visit (INDEPENDENT_AMBULATORY_CARE_PROVIDER_SITE_OTHER): Payer: Self-pay | Admitting: Obstetrics & Gynecology

## 2023-10-06 ENCOUNTER — Ambulatory Visit (INDEPENDENT_AMBULATORY_CARE_PROVIDER_SITE_OTHER): Payer: Self-pay | Admitting: Obstetrics & Gynecology

## 2024-01-19 ENCOUNTER — Ambulatory Visit (INDEPENDENT_AMBULATORY_CARE_PROVIDER_SITE_OTHER): Payer: Self-pay | Admitting: Obstetrics & Gynecology
